# Patient Record
Sex: Male | Born: 1985 | Race: White | Hispanic: No | Marital: Single | Smoking: Current every day smoker
Health system: Southern US, Community
[De-identification: ages and names within clinical notes are randomized; demographics above are authoritative.]

---

## 2009-03-02 ENCOUNTER — Emergency Department: Payer: Self-pay

## 2010-02-18 IMAGING — CR FACIAL BONES COMPLETE 3+V
1 series · 6 of 6 positions shown · non-contrast
Comparison: none

REASON FOR EXAM: injury fall, contusion and swelling
COMMENTS:   LMP: male

[Series 1: view not recorded · 0.17mm/px · 6 of 6 slices shown]
[im 1/6]
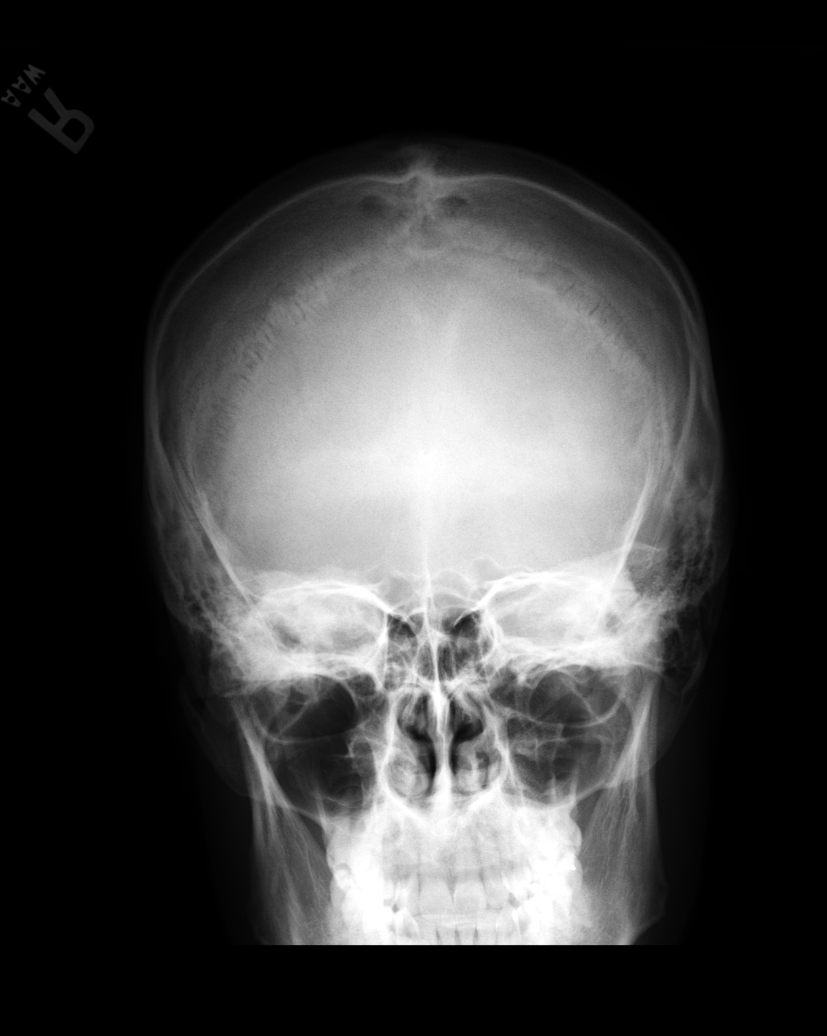
[im 2/6]
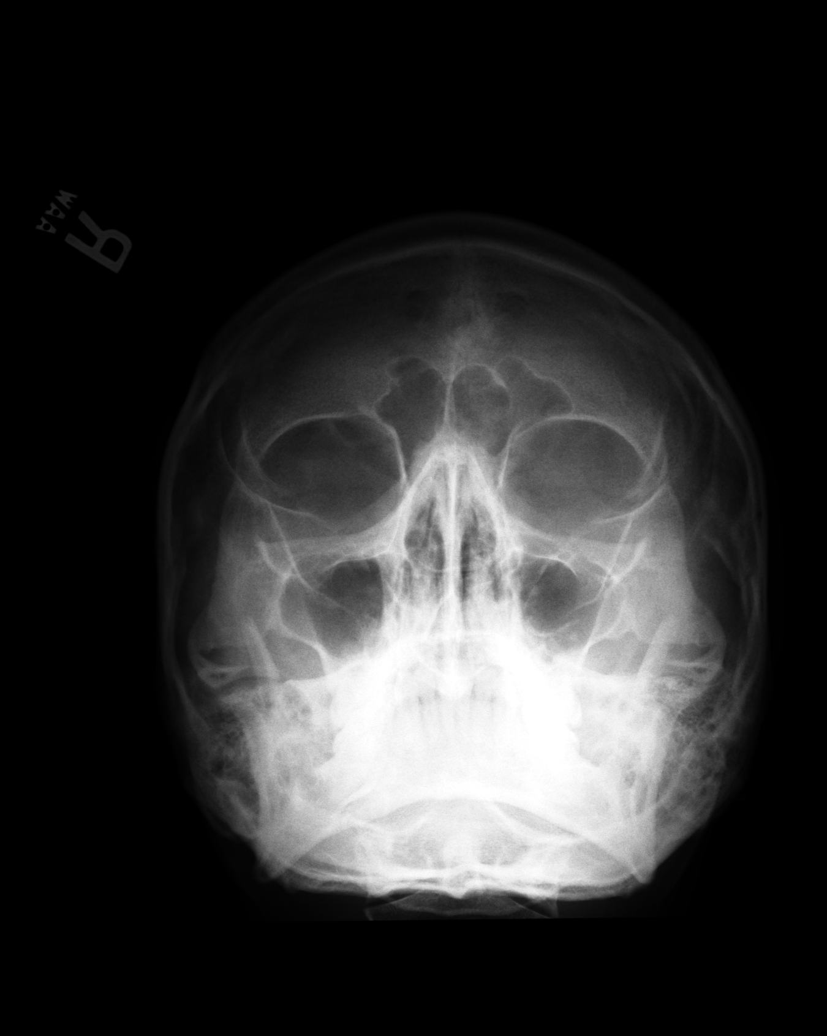
[im 3/6]
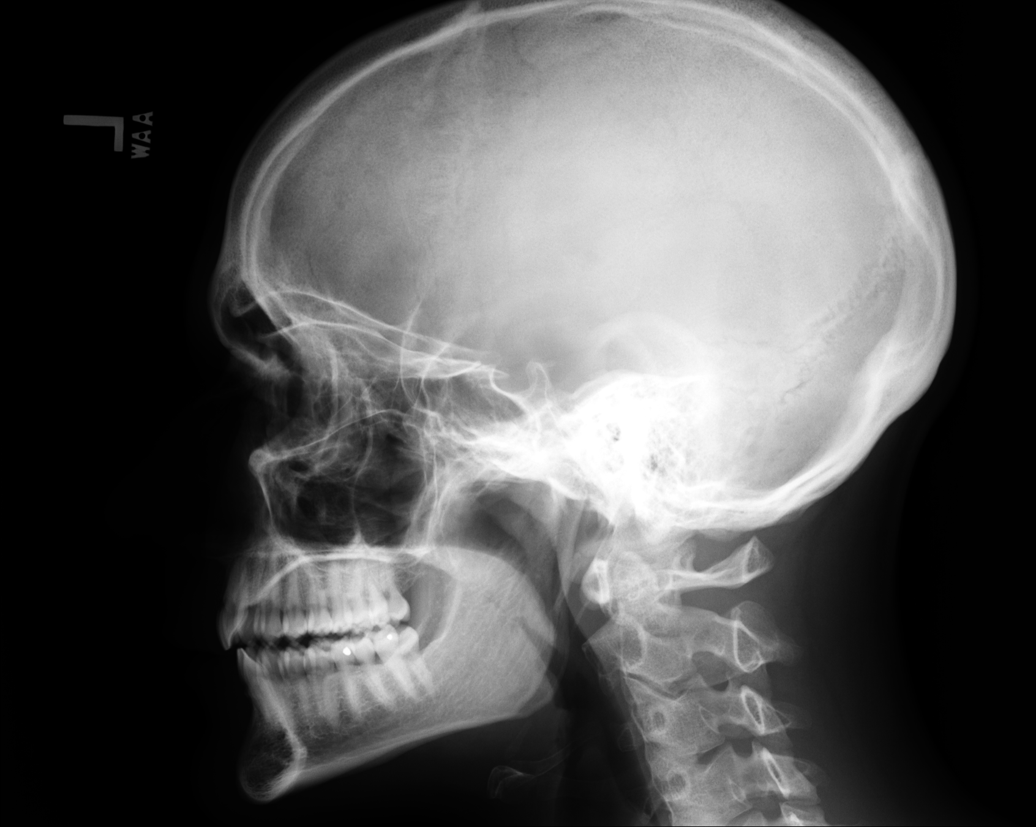
[im 4/6]
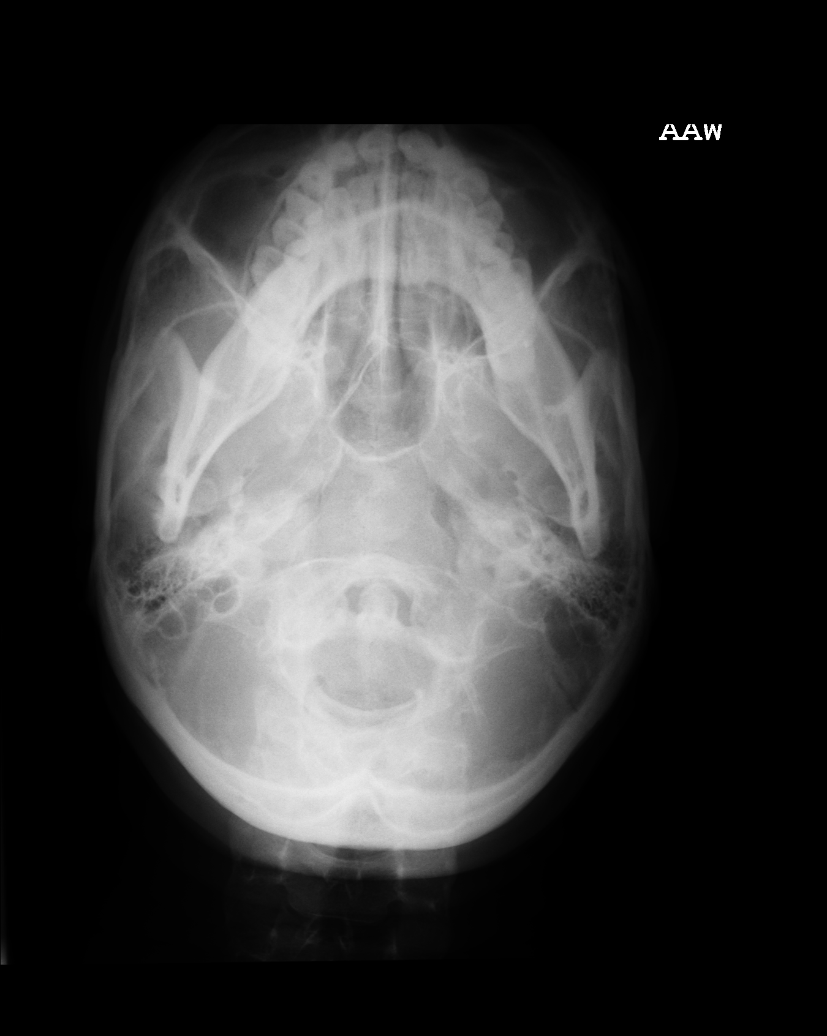
[im 5/6]
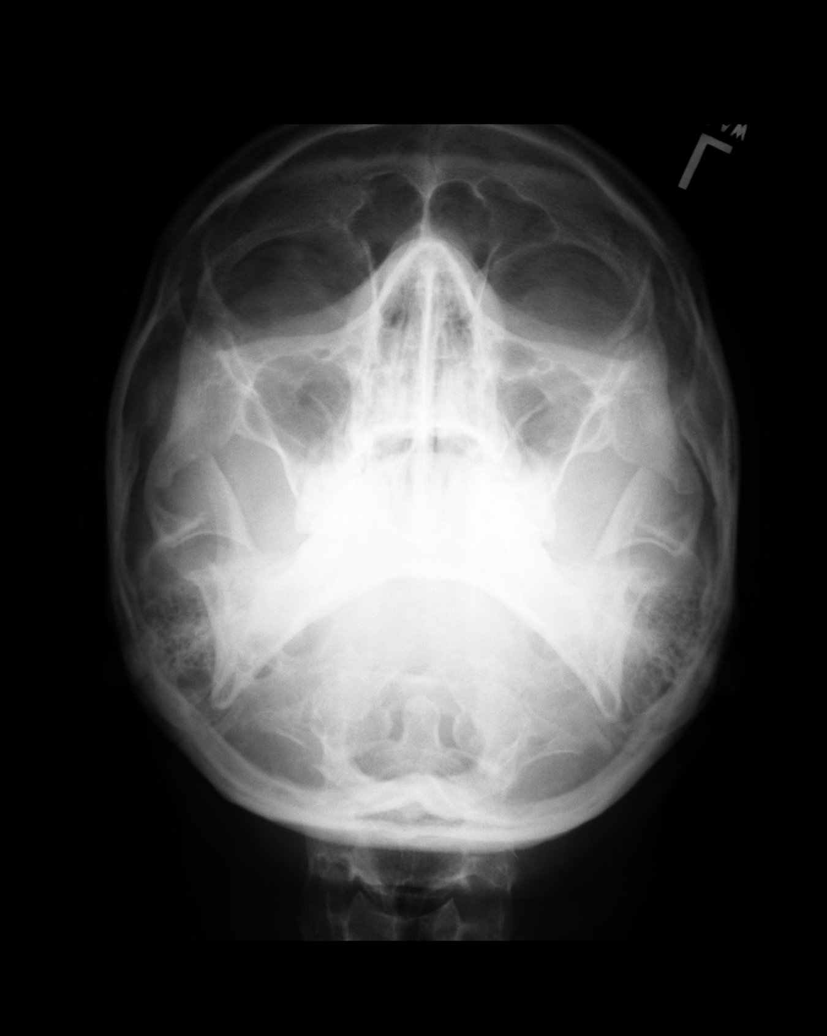
[im 6/6]
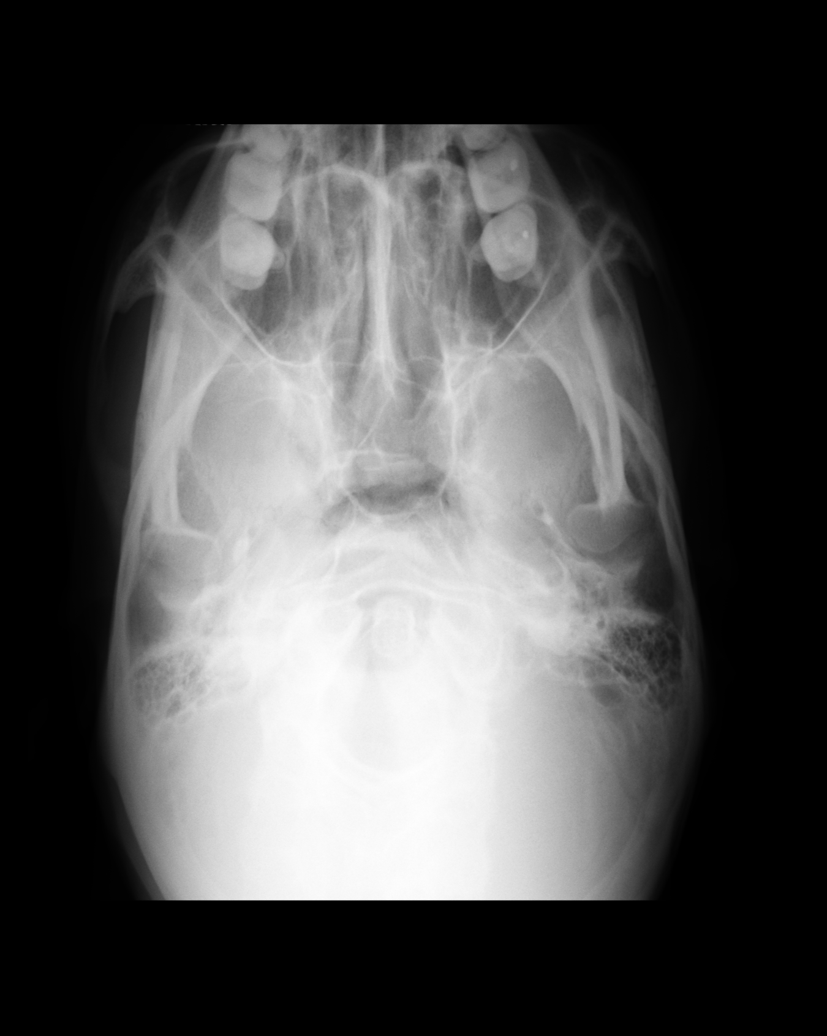

[6 of 6 positions shown; findings below may reference images not displayed]

PROCEDURE:     DXR - DXR FACIAL BONES COMPLETE  - March 02, 2009  [DATE]

RESULT:     Multiple views of the facial bones are submitted. The orbital
bones are grossly intact with no displaced fracture identified. On the
submental vertex view there is questionable abnormality involving the
zygomatic process of the maxillary bone along the inferior lateral aspect of
the left orbit.. As best as can be determined the nasal bone is intact. No
air-fluid levels are demonstrated in the paranasal sinuses. The zygomatic
arches appear intact.
IMPRESSION: I do not see a definite acute displaced fracture the facial
bones. I cannot exclude a nondisplaced fracture of the zygomatic process of
the left maxillary bone. A facial bone CT scan is recommended if there are
strong clinical concerns a of an occult fracture.

## 2017-07-07 ENCOUNTER — Encounter: Payer: Self-pay | Admitting: *Deleted

## 2017-07-07 ENCOUNTER — Emergency Department
Admission: EM | Admit: 2017-07-07 | Discharge: 2017-07-07 | Disposition: A | Discharge status: Home / Self Care | Payer: Self-pay | Attending: Emergency Medicine | Admitting: Emergency Medicine

## 2017-07-07 DIAGNOSIS — F17210 Nicotine dependence, cigarettes, uncomplicated: Secondary | ICD-10-CM | POA: Insufficient documentation

## 2017-07-07 DIAGNOSIS — M25832 Other specified joint disorders, left wrist: Secondary | ICD-10-CM | POA: Insufficient documentation

## 2017-07-07 DIAGNOSIS — X503XXA Overexertion from repetitive movements, initial encounter: Secondary | ICD-10-CM

## 2017-07-07 DIAGNOSIS — M25831 Other specified joint disorders, right wrist: Secondary | ICD-10-CM | POA: Insufficient documentation

## 2017-07-07 MED ORDER — NAPROXEN 500 MG PO TABS: 500.0000 mg | ORAL_TABLET | Freq: Two times a day (BID) | ORAL | 2 refills | Status: AC

## 2017-07-07 NOTE — ED Notes (Signed)
See triage note presents with bilateral hand pain   States pain started about 1 month ago states it is "burning type" pain to knuckles

## 2017-07-07 NOTE — ED Provider Notes (Signed)
[facility] Emergency Department Provider Note   ____________________________________________   First MD Initiated Contact with Patient 07/07/17 [phone]     (approximate)  I have reviewed the triage vital signs and the nursing notes.   HISTORY  Chief Complaint Hand Pain    HPI Erik Crawford is a 31 y.o. male patient complaining of bilateral hand pain for 1 month. Patient states pain consists intermitting numbness and tingling to the hand. Patient is awake history of repetitive motion. No provocative incident for this complaint. Patient is left-hand dominant.  History reviewed. No pertinent past medical history.  There are no active problems to display for this patient.   History reviewed. No pertinent surgical history.  Prior to Admission medications   Medication Sig Start Date End Date Taking? Authorizing Provider  naproxen (NAPROSYN) 500 MG tablet Take 1 tablet (500 mg total) by mouth 2 (two) times daily with a meal. 07/07/17 07/07/18  Joni Reining, PA-C    Allergies Patient has no known allergies.  No family history on file.  Social History Social History  Substance Use Topics  . Smoking status: Current Every Day Smoker    Packs/day: 1.00    Types: Cigarettes  . Smokeless tobacco: Never Used  . Alcohol use Yes    Review of Systems Constitutional: No fever/chills Eyes: No visual changes. ENT: No sore throat. Cardiovascular: Denies chest pain. Respiratory: Denies shortness of breath. Gastrointestinal: No abdominal pain.  No nausea, no vomiting.  No diarrhea.  No constipation. Genitourinary: Negative for dysuria. Musculoskeletal: Bilateral wrist pain Skin: Negative for rash. Neurological: Negative for headaches, focal weakness or numbness.   ____________________________________________   PHYSICAL EXAM:  VITAL SIGNS: ED Triage Vitals [07/07/17 0908]  Enc Vitals Group     BP 126/78     Pulse Rate 84     Resp 20     Temp 98.2  F (36.8 C)     Temp Source Oral     SpO2 100 %     Weight 140 lb (63.5 kg)     Height  (1.676 m)     Head Circumference      Peak Flow      Pain Score 6     Pain Loc      Pain Edu?      Excl. in GC?     Constitutional: Alert and oriented. Well appearing and in no acute distress. Cardiovascular: Normal rate, regular rhythm. Grossly normal heart sounds.  Good peripheral circulation. Respiratory: Normal respiratory effort.  No retractions. Lungs CTAB. Musculoskeletal: No obvious deformity edema or erythema bilateral wrists. Negative Phalen and Tinel's test. Full nuchal range of motion.  Neurologic:  Normal speech and language. No gross focal neurologic deficits are appreciated. No gait instability. Skin:  Skin is warm, dry and intact. No rash noted. Psychiatric: Mood and affect are normal. Speech and behavior are normal.  ____________________________________________   LABS (all labs ordered are listed, but only abnormal results are displayed)  Labs Reviewed - No data to display ____________________________________________  EKG   ____________________________________________  RADIOLOGY  No results found.  ____________________________________________   PROCEDURES  Procedure(s) performed: None  Procedures  Critical Care performed: No  ____________________________________________   INITIAL IMPRESSION / ASSESSMENT AND PLAN / ED COURSE  @  Bilateral wrist pain secondary to repetitive motion. Patient given discharge care instructions. Patient placed in bilateral splints to wear at work. Patient advised to take medication as directed. [facility]      ____________________________________________   FINAL CLINICAL IMPRESSION(S) / ED DIAGNOSES  Final diagnoses:  Repetitive motion disease of right wrist      NEW MEDICATIONS STARTED DURING THIS VISIT:  New Prescriptions     NAPROXEN (NAPROSYN) 500 MG TABLET    Take 1 tablet (500 mg total) by mouth 2 (two) times daily with a meal.     Note:  This document was prepared using Dragon voice recognition software and may include unintentional dictation errors.    Joni Reining, PA-C 07/07/17 8119    Jene Every, MD 07/07/17 1344

## 2017-07-07 NOTE — Discharge Instructions (Signed)
Wear wrist splints at work for 7-10 days.

## 2017-07-07 NOTE — ED Triage Notes (Signed)
Pt complains of bilateral hand pain for 1 month, pt denies any other symptoms

## 2017-07-07 NOTE — ED Triage Notes (Signed)
Pt reports that his hands are hurting for a month. No known injury.

## 2022-04-08 ENCOUNTER — Ambulatory Visit (HOSPITAL_COMMUNITY)
Admission: EM | Admit: 2022-04-08 | Discharge: 2022-04-09 | Disposition: A | Discharge status: Home / Self Care | Payer: No Payment, Other | Attending: Urology | Admitting: Urology

## 2022-04-08 DIAGNOSIS — Z20822 Contact with and (suspected) exposure to covid-19: Secondary | ICD-10-CM | POA: Insufficient documentation

## 2022-04-08 DIAGNOSIS — R440 Auditory hallucinations: Secondary | ICD-10-CM | POA: Insufficient documentation

## 2022-04-08 DIAGNOSIS — Z79899 Other long term (current) drug therapy: Secondary | ICD-10-CM | POA: Diagnosis not present

## 2022-04-08 LAB — POCT URINE DRUG SCREEN - MANUAL ENTRY (I-SCREEN)
POC Amphetamine UR: NOT DETECTED
POC Buprenorphine (BUP): NOT DETECTED
POC Cocaine UR: NOT DETECTED
POC Marijuana UR: NOT DETECTED
POC Methadone UR: NOT DETECTED
POC Methamphetamine UR: NOT DETECTED
POC Morphine: NOT DETECTED
POC Oxazepam (BZO): POSITIVE — AB
POC Oxycodone UR: NOT DETECTED
POC Secobarbital (BAR): NOT DETECTED

## 2022-04-08 LAB — POC SARS CORONAVIRUS 2 AG: SARSCOV2ONAVIRUS 2 AG: NEGATIVE

## 2022-04-08 MED ORDER — MAGNESIUM HYDROXIDE 400 MG/5ML PO SUSP: 30.0000 mL | Freq: Every day | ORAL | Status: DC | PRN

## 2022-04-08 MED ORDER — TRAZODONE HCL 50 MG PO TABS: 50.0000 mg | ORAL_TABLET | Freq: Every evening | ORAL | Status: DC | PRN

## 2022-04-08 MED ORDER — HYDROXYZINE HCL 25 MG PO TABS: 25.0000 mg | ORAL_TABLET | Freq: Three times a day (TID) | ORAL | Status: DC | PRN

## 2022-04-08 MED ORDER — ACETAMINOPHEN 325 MG PO TABS: 650.0000 mg | ORAL_TABLET | Freq: Four times a day (QID) | ORAL | Status: DC | PRN

## 2022-04-08 MED ORDER — ALUM & MAG HYDROXIDE-SIMETH 200-200-20 MG/5ML PO SUSP: 30.0000 mL | ORAL | Status: DC | PRN

## 2022-04-08 NOTE — ED Notes (Signed)
Pt admitted to obs due to substance use, auditory hallucinations, and anxiety. Pt A&O x4, calm and cooperative. Denies SI/HI/VH. Pt tolerated lab work and skin assessment well. Pt ambulated independently to unit. Oriented to unit/staff. Pt declined offer for food/drink at this time. No signs of acute distress noted. Will continue to monitor for safety.

## 2022-04-08 NOTE — Progress Notes (Signed)
   04/08/22 2020  [facility] Triage Screening (Walk-ins at [facility] only)  How Did You Hear About [place]? Other (Comment) [facility])  What Is the Reason for Your Visit/Call Today? Pt reports he has been in [facility] for the past 8 days substance use. He says he was referred to [facility] due to experiencing auditory hallucinations of voices berating him. He says he has not used any substances in two and a half weeks. He says he feels very anxious and these voices make it difficult to concentrate. He denies current suicidal ideation, homicidal ideation, or visual hallucinations.  How Long Has This Been Causing You Problems? 1 wk - 1 month  Have You Recently Had Any Thoughts About Hurting Yourself? No  Are You Planning to Commit Suicide/Harm Yourself At This time? No  Have you Recently Had Thoughts About Hurting Someone Karolee Ohs? No  Are You Planning To Harm Someone At This Time? No  Are you currently experiencing any auditory, visual or other hallucinations? Yes  Please explain the hallucinations you are currently experiencing: Auditory hallucinations of voices berating him.  Have You Used Any Alcohol or Drugs in the Past 24 Hours? No  Do you have any current medical co-morbidities that require immediate attention? No  Clinician description of patient physical appearance/behavior: Pt is casually dressed, alert and oriented x4. Pt speaks in a clear tone, at moderate volume and normal pace. Motor behavior appears normal. Eye contact is good. Pt's mood is anxious and affect is congruent with mood. Thought process is coherent and relevant. There is no indication from Pt's behavior that he is currently responding to internal stimuli or experiencing delusional thought content. He is cooperative.  What Do You Feel Would Help You the Most Today? Medication(s)  If access to [facility] was not available, would you have sought care in the Emergency Department? No  Determination of Need Urgent (48 hours)   Options For Referral Medication Management

## 2022-04-09 ENCOUNTER — Ambulatory Visit (HOSPITAL_COMMUNITY)
Admission: EM | Admit: 2022-04-09 | Discharge: 2022-04-09 | Disposition: A | Discharge status: Home / Self Care | Payer: No Payment, Other | Source: Home / Self Care

## 2022-04-09 DIAGNOSIS — R440 Auditory hallucinations: Secondary | ICD-10-CM

## 2022-04-09 DIAGNOSIS — Z79899 Other long term (current) drug therapy: Secondary | ICD-10-CM | POA: Insufficient documentation

## 2022-04-09 LAB — CBC WITH DIFFERENTIAL/PLATELET
Abs Immature Granulocytes: 0.03 10*3/uL (ref 0.00–0.07)
Basophils Absolute: 0.1 10*3/uL (ref 0.0–0.1)
Basophils Relative: 1 %
Eosinophils Absolute: 0.1 10*3/uL (ref 0.0–0.5)
Eosinophils Relative: 1 %
HCT: 48.2 % (ref 39.0–52.0)
Hemoglobin: 16.9 g/dL (ref 13.0–17.0)
Immature Granulocytes: 0 %
Lymphocytes Relative: 33 %
Lymphs Abs: 3 10*3/uL (ref 0.7–4.0)
MCH: 33.1 pg (ref 26.0–34.0)
MCHC: 35.1 g/dL (ref 30.0–36.0)
MCV: 94.5 fL (ref 80.0–100.0)
Monocytes Absolute: 0.7 10*3/uL (ref 0.1–1.0)
Monocytes Relative: 8 %
Neutro Abs: 5 10*3/uL (ref 1.7–7.7)
Neutrophils Relative %: 57 %
Platelets: 231 10*3/uL (ref 150–400)
RBC: 5.1 MIL/uL (ref 4.22–5.81)
RDW: 11.9 % (ref 11.5–15.5)
WBC: 9 10*3/uL (ref 4.0–10.5)
nRBC: 0 % (ref 0.0–0.2)

## 2022-04-09 LAB — COMPREHENSIVE METABOLIC PANEL
ALT: 21 U/L (ref 0–44)
AST: 19 U/L (ref 15–41)
Albumin: 4.7 g/dL (ref 3.5–5.0)
Alkaline Phosphatase: 63 U/L (ref 38–126)
Anion gap: 7 (ref 5–15)
BUN: 7 mg/dL (ref 6–20)
CO2: 28 mmol/L (ref 22–32)
Calcium: 9.6 mg/dL (ref 8.9–10.3)
Chloride: 106 mmol/L (ref 98–111)
Creatinine, Ser: 0.84 mg/dL (ref 0.61–1.24)
GFR, Estimated: >60 mL/min (ref >60)
Glucose, Bld: 73 mg/dL (ref 70–99)
Potassium: 4 mmol/L (ref 3.5–5.1)
Sodium: 141 mmol/L (ref 135–145)
Total Bilirubin: 0.6 mg/dL (ref 0.3–1.2)
Total Protein: 7.1 g/dL (ref 6.5–8.1)

## 2022-04-09 LAB — LIPID PANEL
Cholesterol: 227 mg/dL — ABNORMAL HIGH (ref 0–200)
HDL: 65 mg/dL (ref >40)
LDL Cholesterol: 150 mg/dL — ABNORMAL HIGH (ref 0–99)
Total CHOL/HDL Ratio: 3.5 RATIO
Triglycerides: 61 mg/dL (ref <150)
VLDL: 12 mg/dL (ref 0–40)

## 2022-04-09 LAB — RESP PANEL BY RT-PCR (FLU A&B, COVID) ARPGX2
Influenza A by PCR: NEGATIVE
Influenza B by PCR: NEGATIVE
SARS Coronavirus 2 by RT PCR: NEGATIVE

## 2022-04-09 LAB — HEMOGLOBIN A1C
Hgb A1c MFr Bld: 5.4 % (ref 4.8–5.6)
Mean Plasma Glucose: 108.28 mg/dL

## 2022-04-09 MED ORDER — QUETIAPINE FUMARATE 25 MG PO TABS: 75.0000 mg | ORAL_TABLET | Freq: Every day | ORAL | Status: DC

## 2022-04-09 MED ORDER — QUETIAPINE FUMARATE 50 MG PO TABS: 50.0000 mg | ORAL_TABLET | Freq: Every day | ORAL | 0 refills | Status: AC

## 2022-04-09 NOTE — ED Triage Notes (Signed)
Pt presents to [facility] voluntarily, accompanied by his mother seeking subtance use treatment. Pt states he was admitted to continuous observation lastnight but became anxious/agitated and decided to leave. Pt states he would like to try the process again. Pt states he has not used any substances in 14 days. Pt reports being in treatment at [facility] for 8 days and reports detox for several days after this admission at [facility]. Pt denies any withdrawal symptoms. Pt denies SI/HI, NSSIB and AVH

## 2022-04-09 NOTE — BH Assessment (Signed)
Comprehensive Clinical Assessment (CCA) Note  04/09/2022 Erik Crawford [id number]  DISPOSITION: Gave clinical report to Erik Asper, NP who recommended Pt be admitted for continuous assessment.  The patient demonstrates the following risk factors for suicide: Chronic risk factors for suicide include: substance use disorder. Acute risk factors for suicide include: family or marital conflict. Protective factors for this patient include: positive social support, positive therapeutic relationship, responsibility to others (children, family), coping skills, and hope for the future. Considering these factors, the overall suicide risk at this point appears to be low. Patient is appropriate for outpatient follow up.  Flowsheet Row ED from 04/08/2022 in [facility]  C-SSRS RISK CATEGORY No Risk      Pt reports he has been in [facility] for the past 8 days substance use. He says he was referred to [facility] due to experiencing auditory hallucinations of voices berating him. He says these hallucinations began after he stopped using methamphetamines and alcohol 2.5 weeks ago. He says he has never experienced hallucinations before. He denies use of any substances in the past 2.5 weeks. He says he feels very anxious and these voices make it difficult to concentrate. He acknowledges episodes of paranoia. He denies depressive symptoms but does report anxiety. He denies problems with sleep or appetite. He denies current suicidal ideation or history of suicide attempts. He denies current homicidal ideation or history of aggression.   Pt says he initially went to [facility] for detox from methamphetamines and alcohol. He then went to [facility] for additional substance abuse treatment. He says he has never received treatment for mental health symptoms in the past.   Pt says he is living with his mother. He reports he and his wife separated in 2021 and she is currently caring for their  three daughters. 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 He denies history of abuse. He denies current legal problems. He denies access to firearms.   Pt is casually dressed, alert and oriented x4. Pt speaks in a clear tone, at moderate volume and normal pace. Motor behavior appears normal. Eye contact is good. Pt's mood is anxious and affect is congruent with mood. Thought process is coherent and relevant. There is no indication from Pt's behavior that he is currently responding to internal stimuli or experiencing delusional thought content. He is cooperative and says he is willing to be admitted to a facility if recommended.   Chief Complaint:  Chief Complaint  Patient presents with   Addiction Problem   Visit Diagnosis:  F29 Unspecified psychotic disorder   CCA Screening, Triage and Referral (STR)  Patient Reported Information How did you hear about [place]? Other (Comment) [facility])  What Is the Reason for Your Visit/Call Today? Pt reports he has been in [facility] for the past 8 days substance use. He says he was referred to [facility] due to experiencing auditory hallucinations of voices berating him. He says he has not used any substances in two and a half weeks. He says he feels very anxious and these voices make it difficult to concentrate. He denies current suicidal ideation, homicidal ideation, or visual hallucinations.  How Long Has This Been Causing You Problems? 1 wk - 1 month  What Do You Feel Would Help You the Most Today? Medication(s)   Have You Recently Had Any Thoughts About Hurting Yourself? No  Are You Planning to Commit Suicide/Harm  Yourself At This time? No   Have you Recently Had Thoughts About Hurting Someone Karolee Crawford? No  Are You Planning to Harm Someone at This Time? No  Explanation: No data recorded  Have You Used Any Alcohol or Drugs in the Past 24 Hours? No  How Long  Ago Did You Use Drugs or Alcohol? No data recorded What Did You Use and How Much? No data recorded  Do You Currently Have a Therapist/Psychiatrist? No  Name of Therapist/Psychiatrist: No data recorded  Have You Been Recently Discharged From Any Office Practice or Programs? Yes  Explanation of Discharge From Practice/Program: [facility]     CCA Screening Triage Referral Assessment Type of Contact: Face-to-Face  Telemedicine Service Delivery:   Is this Initial or Reassessment? No data recorded Date Telepsych consult ordered in [facility]:  No data recorded Time Telepsych consult ordered in [facility]:  No data recorded Location of Assessment: [facility] Assessment Services  Provider Location: [facility] Assessment Services   Collateral Involvement: None   Does Patient Have a Automotive engineer Guardian? No data recorded Name and Contact of Legal Guardian: No data recorded If Minor and Not Living with Parent(s), Who has Custody? NA  Is CPS involved or ever been involved? Never  Is APS involved or ever been involved? Never   Patient Determined To Be At Risk for Harm To Self or Others Based on Review of Patient Reported Information or Presenting Complaint? No  Method: No data recorded Availability of Means: No data recorded Intent: No data recorded Notification Required: No data recorded Additional Information for Danger to Others Potential: No data recorded Additional Comments for Danger to Others Potential: No data recorded Are There Guns or Other Weapons in Your Home? No data recorded Types of Guns/Weapons: No data recorded Are These Weapons Safely Secured?                            No data recorded Who Could Verify You Are Able To Have These Secured: No data recorded Do You Have any Outstanding Charges, Pending Court Dates, Parole/Probation? No data recorded Contacted To Inform of Risk of Harm To Self or Others: No data recorded   Does Patient Present under  Involuntary Commitment? No  IVC Papers Initial File Date: No data recorded  [place] of Residence: [place]   Patient Currently Receiving the Following Services: Not Receiving Services   Determination of Need: Urgent (48 hours)   Options For Referral: Medication Management     CCA Biopsychosocial Patient Reported Schizophrenia/Schizoaffective Diagnosis in Past: No   Strengths: Pt is motivated for treatment.   Mental Health Symptoms Depression:   Difficulty Concentrating   Duration of Depressive symptoms:  Duration of Depressive Symptoms: Less than two weeks   Mania:   None   Anxiety:    Worrying; Tension   Psychosis:   Hallucinations   Duration of Psychotic symptoms:  Duration of Psychotic Symptoms: Less than six months   Trauma:   None   Obsessions:   None   Compulsions:   None   Inattention:   N/A   Hyperactivity/Impulsivity:   N/A   Oppositional/Defiant Behaviors:   N/A   Emotional Irregularity:   None   Other Mood/Personality Symptoms:   None    Mental Status Exam Appearance and self-care  Stature:   Average   Weight:   Average weight   Clothing:   Casual   Grooming:   Normal  Cosmetic use:   None   Posture/gait:   Normal   Motor activity:   Not Remarkable   Sensorium  Attention:   Normal   Concentration:   Normal; Anxiety interferes   Orientation:   X5   Recall/memory:   Normal   Affect and Mood  Affect:   Anxious   Mood:   Anxious   Relating  Eye contact:   Normal   Facial expression:   Responsive   Attitude toward examiner:   Cooperative   Thought and Language  Speech flow:  Clear and Coherent   Thought content:   Appropriate to Mood and Circumstances   Preoccupation:   None   Hallucinations:   Auditory   Organization:  No data recorded  [employer] of Knowledge:   Average   Intelligence:   Average   Abstraction:   Normal   Judgement:   Normal    Reality Testing:   Adequate   Insight:   Fair   Decision Making:   Normal   Social Functioning  Social Maturity:   Responsible   Social Judgement:   Normal; "Street Smart"   Stress  Stressors:   Other (Comment) (Consequences of substance use)   Coping Ability:   Normal   Skill Deficits:   None   Supports:   Family     Religion: Religion/Spirituality Are You A Religious Person?: No How Might This Affect Treatment?: NA  Leisure/Recreation: Leisure / Recreation Do You Have Hobbies?: Yes Leisure and Hobbies: Playing music, wood working  Exercise/Diet: Exercise/Diet Do You Exercise?: No Have You Gained or Lost A Significant Amount of Weight in the Past Six Months?: No Do You Follow a Special Diet?: No Do You Have Any Trouble Sleeping?: No   CCA Employment/Education Employment/Work Situation: Employment / Work Situation Employment Situation: Employed Work Stressors: On leave to receive substance abuse treatment Patient's Job has Been Impacted by Current Illness: Yes Describe how Patient's Job has Been Impacted: On leave to receive substance abuse treatment Has Patient ever Been in the Military?: No  Education: Education Is Patient Currently Attending School?: No Last Grade Completed: 12 Did You Attend College?: No Did You Have An Individualized Education Program (IIEP): No Did You Have Any Difficulty At School?: No Patient's Education Has Been Impacted by Current Illness: No   CCA Family/Childhood History Family and Relationship History: Family history Marital status: Separated Separated, when?: 2021 Does patient have children?: Yes How many children?: 3 How is patient's relationship with their children?: Pt's wife is caring for their three daughters  Childhood History:  Childhood History By whom was/is the patient raised?: Both parents Did patient suffer any verbal/emotional/physical/sexual abuse as a child?: No Did patient suffer from  severe childhood neglect?: No Has patient ever been sexually abused/assaulted/raped as an adolescent or adult?: No Was the patient ever a victim of a crime or a disaster?: No Witnessed domestic violence?: No Has patient been affected by domestic violence as an adult?: No  Child/Adolescent Assessment:     CCA Substance Use Alcohol/Drug Use: Alcohol / Drug Use Pain Medications: Pt reports he has abused pain medications in the past Prescriptions: Denies abuse Over the Counter: Denies abuse History of alcohol / drug use?: Yes Longest period of sobriety (when/how long): Two and a half weeks Negative Consequences of Use: Financial, Personal relationships Withdrawal Symptoms: None Substance #1 Name of Substance 1: Methamphetamines 1 - Age of First Use: 33 1 - Amount (size/oz): 1 gram 1 - Frequency:  Daily 1 - Duration: Two years 1 - Last Use / Amount: Two and a half weeks ago 1 - Method of Aquiring: unknown 1- Route of Use: Smoking and snorting Substance #2 Name of Substance 2: Alcohol 2 - Age of First Use: Adolescent 2 - Amount (size/oz): 12-18 cans of beer 2 - Frequency: Daily 2 - Duration: Ongoing 2 - Last Use / Amount: Two and a half weeks ago 2 - Method of Aquiring: Store 2 - Route of Substance Use: Oral ingestion                     ASAM's:  Six Dimensions of Multidimensional Assessment  Dimension 1:  Acute Intoxication and/or Withdrawal Potential:   Dimension 1:  Description of individual's past and current experiences of substance use and withdrawal: Pt reports a history of using methamphetamines and alcohol daily but denies use in 2.5 weeks  Dimension 2:  Biomedical Conditions and Complications:   Dimension 2:  Description of patient's biomedical conditions and  complications: None  Dimension 3:  Emotional, Behavioral, or Cognitive Conditions and Complications:  Dimension 3:  Description of emotional, behavioral, or cognitive conditions and complications: Pt  reports anxiety and auditory hallucinations  Dimension 4:  Readiness to Change:  Dimension 4:  Description of Readiness to Change criteria: Pt is currently in treatment  Dimension 5:  Relapse, Continued use, or Continued Problem Potential:  Dimension 5:  Relapse, continued use, or continued problem potential critiera description: Pt has limited clean timed  Dimension 6:  Recovery/Living Environment:  Dimension 6:  Recovery/Iiving environment criteria description: Pt staying with his mother  ASAM Severity Score: ASAM's Severity Rating Score: 7  ASAM Recommended Level of Treatment: ASAM Recommended Level of Treatment: Level III Residential Treatment   Substance use Disorder (SUD) Substance Use Disorder (SUD)  Checklist Symptoms of Substance Use: Continued use despite having a persistent/recurrent physical/psychological problem caused/exacerbated by use, Continued use despite persistent or recurrent social, interpersonal problems, caused or exacerbated by use, Large amounts of time spent to obtain, use or recover from the substance(s), Persistent desire or unsuccessful efforts to cut down or control use, Presence of craving or strong urge to use, Substance(s) often taken in larger amounts or over longer times than was intended  Recommendations for Services/Supports/Treatments: Recommendations for Services/Supports/Treatments Recommendations For Services/Supports/Treatments: CD-IOP Intensive Chemical Dependency Program  Discharge Disposition: Discharge Disposition Medical Exam completed: Yes  DSM5 Diagnoses: There are no problems to display for this patient.    Referrals to Alternative Service(s): Referred to Alternative Service(s):   Place:   Date:   Time:    Referred to Alternative Service(s):   Place:   Date:   Time:    Referred to Alternative Service(s):   Place:   Date:   Time:    Referred to Alternative Service(s):   Place:   Date:   Time:     Pamalee Leyden, [facility]

## 2022-04-09 NOTE — ED Provider Notes (Signed)
Behavioral Health Urgent Care Medical Screening Exam  Patient Name: Erik Crawford MRN: [id number] Date of Evaluation: 04/09/22 Chief Complaint:   Diagnosis:  Final diagnoses:  Auditory hallucination    History of Present illness: Erik Crawford is a 36 y.o. male.  Represents to [facility] urgent care after recent requested discharge.  He reports history of substance abuse, auditory hallucinations and anxiety.  Stated he was discharged from [facility] residential treatment facility due to auditory hallucinations.  States he was prescribed Seroquel which he had been taking for the past 2 days however, states he didn't feel like the medication was helping.   TRUE states he got irritated and aggravated because he had to be held on our observation unit. "  Too much going on."  He is denying suicidal or homicidal ideations.  Patient was encouraged to take medications as indicated and follow-up with his outpatient provider.  NP provided patient with 30-day samples of Seroquel 50 mg.  Encouraged to continue to follow-up.  Discussed walk-in clinic with [facility].  He was receptive to plan.  He provided verbal authorization to follow-up with his mother who was here with him in the lobby.  States he has been sober for the past 2 weeks.  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  No other safety concerns noted. patient presented to [facility] as a walk in accompanied by mother with complaints of intermittent paranoia and auditory hallucinations.  He denied voices are command in nature.  During evaluation Erik Crawford is sitting in no acute distress. He is alert/oriented x 4; calm/cooperative; and mood congruent with affect. He is speaking in a clear tone at moderate volume, and normal pace; with good eye contact. His thought process is coherent and relevant; There is no indication that he is currently responding to internal/external stimuli or experiencing delusional thought content;  and he has denied suicidal/self-harm/homicidal ideation, psychosis, and paranoia.   Patient has remained calm throughout assessment and has answered questions appropriately.    At this time Erik Crawford is educated and verbalizes understanding of mental health resources and other crisis services in the community. He is instructed to call 911 and present to the nearest emergency room should he experience any suicidal/homicidal ideation, auditory/visual/hallucinations, or detrimental worsening of his mental health condition. He was a also advised by Clinical research associate that he could call the toll-free phone on insurance card to assist with identifying in network counselors and agencies or number on back of Medicaid card to speak with care coordinator    Psychiatric Specialty Exam  Presentation  General Appearance:Appropriate for Environment  Eye Contact:Good  Speech:Clear and Coherent  Speech Volume:Normal  Handedness:Right   Mood and Affect  Mood:Anxious Affect:Congruent  Thought Process  Thought Processes:Coherent Descriptions of Associations:Intact  Orientation:Full (Time, Place and Person)  Thought Content:Logical  Diagnosis of Schizophrenia or Schizoaffective disorder in past: No  Duration of Psychotic Symptoms: Less than six months  Hallucinations:Auditory  Ideas of Reference:None  Suicidal Thoughts:No  Homicidal Thoughts:No   Sensorium  Memory:Immediate Fair; Recent Fair Judgment:Fair Insight:Fair  Executive Functions  Concentration:Fair Attention Span:Fair Recall:Fair Fund of Knowledge:Good Language:Fair  Psychomotor Activity  Psychomotor Activity:Normal  Assets  Assets:Social Support; Desire for Improvement  Sleep  Sleep:Fair Number of hours: No data recorded  Nutritional Assessment (For [facility] and [facility] admissions only) Has the patient had a weight loss or gain of 10 pounds or more in the last 3 months?: No Has the patient had a decrease in food intake/or  appetite?: No Does the patient have dental problems?: No Does the patient have eating habits or behaviors that may be indicators of an eating disorder including binging or inducing vomiting?: No Has the patient recently lost weight without trying?: 0 Has the patient been eating poorly because of a decreased appetite?: 0 Malnutrition Screening Tool Score: 0    Physical Exam: Physical Exam Vitals reviewed.  Cardiovascular:     Rate and Rhythm: Normal rate and regular rhythm.  Pulmonary:     Effort: Pulmonary effort is normal.  Neurological:     General: No focal deficit present.     Mental Status: He is alert.  Psychiatric:        Attention and Perception: Attention normal.        Mood and Affect: Mood normal.        Speech: Speech normal.        Behavior: Behavior normal.        Thought Content: Thought content normal.        Cognition and Memory: Cognition normal.        Judgment: Judgment normal.    Review of Systems  HENT: Negative.    Cardiovascular: Negative.   Gastrointestinal: Negative.   Genitourinary: Negative.   Skin: Negative.   Psychiatric/Behavioral:  Positive for hallucinations.   All other systems reviewed and are negative.  Blood pressure 129/74, pulse 95, temperature 98.4 F (36.9 C), temperature source Oral, resp. rate 18, SpO2 97 %. There is no height or weight on file to calculate BMI.  Musculoskeletal: Strength & Muscle Tone: within normal limits Gait & Station: normal Patient leans: N/A   [facility] MSE Discharge Disposition for Follow up and Recommendations: Based on my evaluation the patient does not appear to have an emergency medical condition and can be discharged with resources and follow up care in outpatient services for Medication Management and Substance Abuse Intensive Outpatient Program  Case management provided additional outpatient resources for [facility] in [place].  Patient and family was receptive to plan   Oneta Rack,  NP 04/09/2022, 4:24 PM

## 2022-04-09 NOTE — Discharge Instructions (Addendum)
Take all medications as prescribed. Keep all follow-up appointments as scheduled.  Do not consume alcohol or use illegal drugs while on prescription medications. Report any adverse effects from your medications to your primary care provider promptly.  In the event of recurrent symptoms or worsening symptoms, call 911, a crisis hotline, or go to the nearest emergency department for evaluation.   

## 2022-04-09 NOTE — Discharge Instructions (Addendum)

## 2022-04-09 NOTE — ED Notes (Addendum)
Pt requesting to leave, becoming agitated. Roena Malady, NP and Roselyn Bering, NP made aware.
